# Patient Record
Sex: Female | Born: 1995
Health system: Southern US, Community
[De-identification: ages and names within clinical notes are randomized; demographics above are authoritative.]

## PROBLEM LIST (undated history)

## (undated) DIAGNOSIS — F419 Anxiety disorder, unspecified: Secondary | ICD-10-CM

## (undated) DIAGNOSIS — L309 Dermatitis, unspecified: Secondary | ICD-10-CM

## (undated) HISTORY — PX: COCHLEAR IMPLANT: SUR684

## (undated) HISTORY — PX: COCHLEAR IMPLANT REMOVAL: SHX1365

## (undated) HISTORY — PX: MASTECTOMY: SHX3

---
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
## 2014-12-09 ENCOUNTER — Encounter: Payer: Self-pay | Admitting: *Deleted

## 2014-12-09 ENCOUNTER — Emergency Department (INDEPENDENT_AMBULATORY_CARE_PROVIDER_SITE_OTHER)
Admission: EM | Admit: 2014-12-09 | Discharge: 2014-12-09 | Disposition: A | Discharge status: Home / Self Care | Payer: 59 | Source: Home / Self Care | Attending: Emergency Medicine | Admitting: Emergency Medicine

## 2014-12-09 DIAGNOSIS — B2790 Infectious mononucleosis, unspecified without complication: Secondary | ICD-10-CM

## 2014-12-09 DIAGNOSIS — J039 Acute tonsillitis, unspecified: Secondary | ICD-10-CM

## 2014-12-09 DIAGNOSIS — J029 Acute pharyngitis, unspecified: Secondary | ICD-10-CM

## 2014-12-09 LAB — POCT MONO SCREEN (KUC): Mono, POC: POSITIVE — AB

## 2014-12-09 LAB — POCT RAPID STREP A (OFFICE): Rapid Strep A Screen: NEGATIVE

## 2014-12-09 MED ORDER — METHYLPREDNISOLONE ACETATE 80 MG/ML IJ SUSP
80.0000 mg | Freq: Once | INTRAMUSCULAR | Status: AC
  Administered 2014-12-09: 80 mg via INTRAMUSCULAR

## 2014-12-09 MED ORDER — PREDNISONE 20 MG PO TABS: 20.0000 mg | ORAL_TABLET | Freq: Two times a day (BID) | ORAL | Status: DC

## 2014-12-09 MED ORDER — CEFDINIR 300 MG PO CAPS: 300.0000 mg | ORAL_CAPSULE | Freq: Two times a day (BID) | ORAL | Status: DC

## 2014-12-09 NOTE — ED Notes (Signed)
Pt c/o sore throat, HA, fatigue, and diarrhea x 11/30/14.

## 2014-12-09 NOTE — ED Provider Notes (Signed)
CSN: [id number]     Arrival date & time 12/09/14  1140 History   First MD Initiated Contact with Patient 12/09/14 1148     Chief Complaint  Patient presents with  . Sore Throat  . Headache    HPI SORE THROAT Onset: 10 days    Severity: moderate-severe, progressively worsening. Tried OTC meds without significant relief. + Fatigue, progressively worsening. No syncope or focal neurologic symptoms. Associated Symptoms:  + Minimal Fever  + Swollen neck glands No Recent Strep Exposure   No Myalgias Mild, nonfocal Headache.  No Rash No Discolored Nasal Mucus No Allergy symptoms No sinus pain/pressure No itchy/red eyes No earache No Drooling No Trismus  +Minimal Nausea No Vomiting No Abdominal pain Occasional Diarrhea, no blood or mucus. No current diarrhea. Denies recent foreign travel No Reflux symptoms No Cough No Breathing Difficulty No Shortness of Breath No pleuritic pain No Wheezing No Hemoptysis Denies GYN or GU symptoms. Denies chance of pregnancy. Last menstrual period normal 1 week ago.  History reviewed. No pertinent past medical history. Past Surgical History  Procedure Laterality Date  . Cochlear implant      placed and removed 2012   History reviewed. No pertinent family history. History  Substance Use Topics  . Smoking status: Former Games developermoker  . Smokeless tobacco: Not on file  . Alcohol Use: No   OB History    No data available     Review of Systems  All other systems reviewed and are negative.   Allergies  Review of patient's allergies indicates no known allergies.  Home Medications   Prior to Admission medications   Medication Sig Start Date End Date Taking? Authorizing Provider  cefdinir (OMNICEF) 300 MG capsule Take 1 capsule (300 mg total) by mouth 2 (two) times daily. X 10 days 12/09/14   Lajean Manesavid Massey, MD  predniSONE (DELTASONE) 20 MG tablet Take 1 tablet (20 mg total) by mouth 2 (two) times daily with a meal. X 5 days 12/09/14    Lajean Manesavid Massey, MD   BP 113/73 mmHg  Pulse 65  Temp(Src) 98.2 F (36.8 C) (Oral)  Resp 16  Ht 5\' 1"  (1.549 m)  Wt 141 lb (63.957 kg)  BMI 26.66 kg/m2  SpO2 99% Physical Exam  Constitutional: She is oriented to person, place, and time. She appears well-developed and well-nourished.  Non-toxic appearance. She appears ill. No distress.  HENT:  Head: Normocephalic and atraumatic.  Right Ear: Tympanic membrane, external ear and ear canal normal.  Left Ear: Tympanic membrane, external ear and ear canal normal.  Nose: Nose normal. Right sinus exhibits no maxillary sinus tenderness and no frontal sinus tenderness. Left sinus exhibits no maxillary sinus tenderness and no frontal sinus tenderness.  Mouth/Throat: Uvula is midline and mucous membranes are normal. No oral lesions. Posterior oropharyngeal erythema present. No oropharyngeal exudate or tonsillar abscesses.  4+ Tonsillar redness and enlargement. Whitish exudates bilaterally  Airway intact.  Eyes: Conjunctivae are normal. No scleral icterus.  Neck: Neck supple.  Cardiovascular: Normal rate, regular rhythm and normal heart sounds.   No murmur heard. Pulmonary/Chest: Effort normal and breath sounds normal. No stridor. No respiratory distress. She has no wheezes. She has no rhonchi. She has no rales.  Abdominal: Soft. She exhibits no mass. There is no hepatosplenomegaly. There is no tenderness.  Lymphadenopathy:    She has cervical adenopathy.       Right cervical: Superficial cervical adenopathy present. No deep cervical and no posterior cervical adenopathy present.  Left cervical: Superficial cervical adenopathy present. No deep cervical and no posterior cervical adenopathy present.  Neurological: She is alert and oriented to person, place, and time.  Skin: Skin is warm. No rash noted.  Psychiatric: She has a normal mood and affect.  Nursing note and vitals reviewed.   ED Course  Procedures (including critical care time) Labs  Review Labs Reviewed  POCT MONO SCREEN [facility] - Abnormal; Notable for the following:    Mono, POC Positive (*)    All other components within normal limits  STREP A DNA PROBE  POCT RAPID STREP A (OFFICE)   Results for orders placed or performed during the hospital encounter of 12/09/14  POCT rapid strep A  Result Value Ref Range   Rapid Strep A Screen Negative Negative  POCT mono screen  Result Value Ref Range   Mono, POC Positive (A) Negative    MDM   1. Infectious mononucleosis   2. Acute pharyngitis, unspecified pharyngitis type   3. Exudative tonsillitis   Positive Monospot Rapid strep test negative, will send off strep culture Discussed with patient at length. Handout on mononucleosis given. Treatment options discussed, as well as risks, benefits, alternatives. Patient voiced understanding and agreement with the following plans: Depo-Medrol 80 mg IM New Prescriptions   CEFDINIR (OMNICEF) 300 MG CAPSULE    Take 1 capsule (300 mg total) by mouth 2 (two) times daily. X 10 days   PREDNISONE (DELTASONE) 20 MG TABLET    Take 1 tablet (20 mg total) by mouth 2 (two) times daily with a meal. X 5 days   Over 40 minutes spent, greater than 50% of the time spent for counseling and coordination of care. Follow-up with your primary care doctor in 5-7 days if not improving, or sooner if symptoms become worse. Precautions discussed. Red flags discussed.--Emergency room if any red flag Questions invited and answered. Patient voiced understanding and agreement.   Lajean Manesavid Massey, MD 12/09/14 [phone]

## 2014-12-10 LAB — STREP A DNA PROBE: GASP: NEGATIVE

## 2016-05-09 MED FILL — CLOBETASOL 0.05% OINTMENT: 0.05 | 20 days supply | Qty: 60 | Fill #0

## 2016-05-26 DIAGNOSIS — L700 Acne vulgaris: Secondary | ICD-10-CM | POA: Diagnosis not present

## 2016-05-26 DIAGNOSIS — F633 Trichotillomania: Secondary | ICD-10-CM | POA: Diagnosis not present

## 2016-05-26 DIAGNOSIS — L2084 Intrinsic (allergic) eczema: Secondary | ICD-10-CM | POA: Diagnosis not present

## 2016-05-26 MED FILL — HYDROCORTISONE 2.5% CREAM: 2.5 | 30 days supply | Qty: 60 | Fill #0

## 2016-05-26 MED FILL — TRIAMCINOLONE 0.1% OINTMENT: 0.1 | 30 days supply | Qty: 454 | Fill #0

## 2016-05-26 MED FILL — ACZONE 5% GEL: 5 | 60 days supply | Qty: 60 | Fill #0

## 2016-08-02 DIAGNOSIS — H1045 Other chronic allergic conjunctivitis: Secondary | ICD-10-CM | POA: Diagnosis not present

## 2016-08-02 DIAGNOSIS — H52223 Regular astigmatism, bilateral: Secondary | ICD-10-CM | POA: Diagnosis not present

## 2016-08-02 DIAGNOSIS — Z0000 Encounter for general adult medical examination without abnormal findings: Secondary | ICD-10-CM | POA: Diagnosis not present

## 2016-08-02 DIAGNOSIS — E559 Vitamin D deficiency, unspecified: Secondary | ICD-10-CM | POA: Diagnosis not present

## 2016-08-03 DIAGNOSIS — E559 Vitamin D deficiency, unspecified: Secondary | ICD-10-CM | POA: Diagnosis not present

## 2016-08-03 DIAGNOSIS — Z0000 Encounter for general adult medical examination without abnormal findings: Secondary | ICD-10-CM | POA: Diagnosis not present

## 2016-09-23 DIAGNOSIS — L219 Seborrheic dermatitis, unspecified: Secondary | ICD-10-CM | POA: Diagnosis not present

## 2016-09-23 DIAGNOSIS — L700 Acne vulgaris: Secondary | ICD-10-CM | POA: Diagnosis not present

## 2016-09-23 DIAGNOSIS — L2089 Other atopic dermatitis: Secondary | ICD-10-CM | POA: Diagnosis not present

## 2016-12-21 MED FILL — TRIAMCINOLONE 0.1% OINTMENT: 0.1 | 30 days supply | Qty: 454 | Fill #1

## 2016-12-22 MED FILL — ESCITALOPRAM 10 MG TABLET: 10 | 30 days supply | Qty: 30 | Fill #0

## 2017-01-09 ENCOUNTER — Encounter: Payer: Self-pay | Admitting: *Deleted

## 2017-01-09 ENCOUNTER — Emergency Department
Admission: EM | Admit: 2017-01-09 | Discharge: 2017-01-09 | Disposition: A | Discharge status: Home / Self Care | Payer: 59 | Source: Home / Self Care | Attending: Family Medicine | Admitting: Family Medicine

## 2017-01-09 DIAGNOSIS — M545 Low back pain, unspecified: Secondary | ICD-10-CM

## 2017-01-09 DIAGNOSIS — L739 Follicular disorder, unspecified: Secondary | ICD-10-CM

## 2017-01-09 MED ORDER — DOXYCYCLINE HYCLATE 100 MG PO CAPS: 100.0000 mg | ORAL_CAPSULE | Freq: Two times a day (BID) | ORAL | 1 refills | Status: DC

## 2017-01-09 MED ORDER — MELOXICAM 15 MG PO TABS: 15.0000 mg | ORAL_TABLET | Freq: Every day | ORAL | 1 refills | Status: AC

## 2017-01-09 MED ORDER — METHOCARBAMOL 750 MG PO TABS: ORAL_TABLET | ORAL | 1 refills | Status: AC

## 2017-01-09 NOTE — Discharge Instructions (Signed)
Apply ice pack to lower back for 20 to 30 minutes, 3 to 4 times daily  Continue until pain decreases.  Begin range of motion and stretching exercises as tolerated.

## 2017-01-09 NOTE — ED Provider Notes (Signed)
[facility]    CSN: [id number] Arrival date & time: 01/09/17  1609     History   Chief Complaint Chief Complaint  Patient presents with  . Back Pain    HPI Katrina Burgess is a 21 y.o. female.   Patient presents with two complaints: 1)  For about 1.5 weeks she has had recurrent non-radiating lower back pain.  She has begun working out in gym recently but does not remember injury.   She denies bowel or bladder dysfunction, and no saddle numbness.  She injured her lower back playing soccer about 4 years ago.  X-rays were negative, and she improved after physical therapy.  She states that she tends to have recurring back pain during weather changes.  2)  She also complains of intermittently recurring painful "bumps" in both axillae that recur intermittently and resolve spontaneously.  She states that she rarely shaves her axillae.  She presently has 3 areas of soreness in her left axilla.   The history is provided by the patient.  Back Pain  Location:  Lumbar spine Quality:  Stabbing Radiates to:  Does not radiate Pain severity:  Moderate Pain is:  Worse during the day Onset quality:  Sudden Duration:  10 days Timing:  Intermittent Progression:  Unchanged Chronicity:  Recurrent Context comment:  Worse with weather changes Relieved by:  None tried Worsened by:  Standing Ineffective treatments:  Heating pad Associated symptoms: no abdominal pain, no abdominal swelling, no bladder incontinence, no bowel incontinence, no chest pain, no dysuria, no fever, no headaches, no leg pain, no numbness, no paresthesias, no pelvic pain, no perianal numbness, no tingling, no weakness and no weight loss   Risk factors: obesity     History reviewed. No pertinent past medical history.  There are no active problems to display for this patient.   Past Surgical History:  Procedure Laterality Date  . COCHLEAR IMPLANT     placed and removed 2012    OB History    No data  available       Home Medications    Prior to Admission medications   Medication Sig Start Date End Date Taking? Authorizing Provider  escitalopram (LEXAPRO) 10 MG tablet Take 10 mg by mouth daily.   Yes Historical Provider, MD  doxycycline (VIBRAMYCIN) 100 MG capsule Take 1 capsule (100 mg total) by mouth 2 (two) times daily. Take with food. 01/09/17   Lattie Haw, MD  meloxicam (MOBIC) 15 MG tablet Take 1 tablet (15 mg total) by mouth daily. Take with food each morning 01/09/17   Lattie Haw, MD  methocarbamol (ROBAXIN) 750 MG tablet One tab PO HS for muscle spasm 01/09/17   Lattie Haw, MD    Family History History reviewed. No pertinent family history.  Social History Social History  Substance Use Topics  . Smoking status: Former Games developer  . Smokeless tobacco: Never Used  . Alcohol use No     Allergies   Patient has no known allergies.   Review of Systems Review of Systems  Constitutional: Negative for fever and weight loss.  HENT: Negative.   Eyes: Negative.   Respiratory: Negative.   Cardiovascular: Negative for chest pain.  Gastrointestinal: Negative for abdominal pain and bowel incontinence.  Genitourinary: Negative for bladder incontinence, dysuria and pelvic pain.  Musculoskeletal: Positive for back pain.  Skin: Positive for rash.  Neurological: Negative for tingling, weakness, numbness, headaches and paresthesias.     Physical Exam Triage Vital Signs  ED Triage Vitals  Enc Vitals Group     BP 01/09/17 1643 124/82     Pulse Rate 01/09/17 1643 93     Resp --      Temp 01/09/17 1643 98 F (36.7 C)     Temp Source 01/09/17 1643 Oral     SpO2 01/09/17 1643 100 %     Weight 01/09/17 1644 170 lb (77.1 kg)     Height --      Head Circumference --      Peak Flow --      Pain Score 01/09/17 1645 6     Pain Loc --      Pain Edu? --      Excl. in GC? --    No data found.   Updated Vital Signs BP 124/82 (BP Location: Left Arm)   Pulse 93    Temp 98 F (36.7 C) (Oral)   Wt 170 lb (77.1 kg)   SpO2 100%   BMI 32.12 kg/m   Visual Acuity Right Eye Distance:   Left Eye Distance:   Bilateral Distance:    Right Eye Near:   Left Eye Near:    Bilateral Near:     Physical Exam  Constitutional: She appears well-developed and well-nourished. No distress.  HENT:  Head: Normocephalic.  Right Ear: External ear normal.  Left Ear: External ear normal.  Nose: Nose normal.  Mouth/Throat: Oropharynx is clear and moist.  Eyes: Conjunctivae are normal. Pupils are equal, round, and reactive to light.  Neck: Normal range of motion.  Cardiovascular: Normal heart sounds.   Pulmonary/Chest: Breath sounds normal.  Abdominal: Soft. There is tenderness.  Musculoskeletal: She exhibits no edema.       Back:  Back:  Range of motion relatively well preserved.  Can heel/toe walk and squat without difficulty.  Tenderness in the midline and bilateral paraspinous muscles from L1 to Sacral area.  Straight leg raising test is negative.  Sitting knee extension test is negative.  Strength and sensation in the lower extremities is normal.  Patellar and achilles reflexes are normal.  Hips have full range of motion.  FABER negative.   Neurological: She is alert.  Skin: Skin is warm and dry.  Left axilla has three areas of peri-follicular erythema and tenderness without induration or fluctuance.  No pustules present.  Nursing note and vitals reviewed.    UC Treatments / Results  Labs (all labs ordered are listed, but only abnormal results are displayed) Labs Reviewed - No data to display  EKG  EKG Interpretation None       Radiology No results found.  Procedures Procedures (including critical care time)  Medications Ordered in UC Medications - No data to display   Initial Impression / Assessment and Plan / UC Course  I have reviewed the triage vital signs and the nursing notes.  Pertinent labs & imaging results that were available  during my care of the patient were reviewed by me and considered in my medical decision making (see chart for details).  Clinical Course   Begin Mobic 15mg  Daily, and Robaxin 750mg  HS. Apply ice pack for 20 to 30 minutes, 3 to 4 times daily  Continue until pain decreases.  Begin range of motion and stretching exercises as tolerated. Consider PT (patient will notify us if she wishes referral). Followup with Dr. Rodney Langtonhomas Thekkekandam or Dr. Clementeen GrahamEvan [facility] ([facility] if not improving about two weeks.  Begin doxycycline 100mg  BID; continue for about 5  to 7 days until lesions resolve.  May repeat treatment if recurs.    Final Clinical Impressions(s) / UC Diagnoses   Final diagnoses:  Bilateral low back pain without sciatica, unspecified chronicity  Folliculitis    New Prescriptions New Prescriptions   DOXYCYCLINE (VIBRAMYCIN) 100 MG CAPSULE    Take 1 capsule (100 mg total) by mouth 2 (two) times daily. Take with food.   MELOXICAM (MOBIC) 15 MG TABLET    Take 1 tablet (15 mg total) by mouth daily. Take with food each morning   METHOCARBAMOL (ROBAXIN) 750 MG TABLET    One tab PO HS for muscle spasm     Lattie Haw, MD 01/09/17 1753

## 2017-01-09 NOTE — ED Triage Notes (Signed)
Patient c/o recurrent low back that initially started with an injury playing soccer in 2014. More recently worse. Used heat @ home. No otc meds used.

## 2017-01-10 MED FILL — DOXYCYCLINE HYCLATE 100 MG: 100 | 10 days supply | Qty: 20 | Fill #0

## 2017-01-10 MED FILL — MELOXICAM 15 MG TABLET: 15 | 15 days supply | Qty: 15 | Fill #0

## 2017-01-10 MED FILL — METHOCARBAMOL 750 MG TABLET: 750 | 15 days supply | Qty: 15 | Fill #0

## 2017-02-20 MED FILL — ESCITALOPRAM 10 MG TABLET: 10 | 30 days supply | Qty: 30 | Fill #1

## 2017-05-29 DIAGNOSIS — L2089 Other atopic dermatitis: Secondary | ICD-10-CM | POA: Diagnosis not present

## 2017-05-29 DIAGNOSIS — L219 Seborrheic dermatitis, unspecified: Secondary | ICD-10-CM | POA: Diagnosis not present

## 2017-05-29 DIAGNOSIS — L700 Acne vulgaris: Secondary | ICD-10-CM | POA: Diagnosis not present

## 2017-05-29 DIAGNOSIS — L2084 Intrinsic (allergic) eczema: Secondary | ICD-10-CM | POA: Diagnosis not present

## 2017-05-29 DIAGNOSIS — F633 Trichotillomania: Secondary | ICD-10-CM | POA: Diagnosis not present

## 2017-05-29 DIAGNOSIS — L089 Local infection of the skin and subcutaneous tissue, unspecified: Secondary | ICD-10-CM | POA: Diagnosis not present

## 2017-05-29 MED FILL — DAPSONE 5% GEL: 5 | 30 days supply | Qty: 60 | Fill #0

## 2017-05-29 MED FILL — CLOBETASOL 0.05% OINTMENT: 0.05 | 10 days supply | Qty: 60 | Fill #0

## 2017-05-29 MED FILL — AVITA 0.025% CREAM: 0.025 | 90 days supply | Qty: 45 | Fill #0

## 2017-05-29 MED FILL — TRIAMCINOLONE 0.1% OINTMENT: 0.1 | 30 days supply | Qty: 454 | Fill #0

## 2017-06-12 DIAGNOSIS — L02811 Cutaneous abscess of head [any part, except face]: Secondary | ICD-10-CM | POA: Diagnosis not present

## 2017-06-12 MED FILL — HYDROCODON-APAP 5-325: 5-325 | 2 days supply | Qty: 6 | Fill #0

## 2017-06-12 MED FILL — SULFAMETHOXAZOLE/TMP DS TAB: 800-160 | 10 days supply | Qty: 20 | Fill #0

## 2017-08-31 DIAGNOSIS — M545 Low back pain: Secondary | ICD-10-CM | POA: Diagnosis not present

## 2017-09-08 DIAGNOSIS — L259 Unspecified contact dermatitis, unspecified cause: Secondary | ICD-10-CM | POA: Diagnosis not present

## 2017-09-08 DIAGNOSIS — L2084 Intrinsic (allergic) eczema: Secondary | ICD-10-CM | POA: Diagnosis not present

## 2017-09-08 MED FILL — ELIDEL 1% CREAM: 1 | 30 days supply | Qty: 60 | Fill #0

## 2017-09-30 DIAGNOSIS — R21 Rash and other nonspecific skin eruption: Secondary | ICD-10-CM | POA: Diagnosis not present

## 2017-12-15 DIAGNOSIS — L089 Local infection of the skin and subcutaneous tissue, unspecified: Secondary | ICD-10-CM | POA: Diagnosis not present

## 2017-12-15 DIAGNOSIS — L2084 Intrinsic (allergic) eczema: Secondary | ICD-10-CM | POA: Diagnosis not present

## 2017-12-15 DIAGNOSIS — Z79899 Other long term (current) drug therapy: Secondary | ICD-10-CM | POA: Diagnosis not present

## 2017-12-15 MED FILL — METHOTREXATE 2.5 MG TABLET: 2.5 | 28 days supply | Qty: 12 | Fill #0

## 2017-12-15 MED FILL — CLOBETASOL 0.05% OINTMENT: 0.05 | 14 days supply | Qty: 60 | Fill #0

## 2017-12-15 MED FILL — hydrOXYzine HCL 10 MG TABS: 10 | 10 days supply | Qty: 90 | Fill #0 | Status: TO

## 2017-12-15 MED FILL — FOLIC ACID 1 MG TABLET: 1 | 30 days supply | Qty: 30 | Fill #0

## 2018-01-12 DIAGNOSIS — Z5181 Encounter for therapeutic drug level monitoring: Secondary | ICD-10-CM | POA: Diagnosis not present

## 2018-01-12 DIAGNOSIS — Z79899 Other long term (current) drug therapy: Secondary | ICD-10-CM | POA: Diagnosis not present

## 2018-01-12 MED FILL — hydrOXYzine HCL 10 MG TABS: 10 | 10 days supply | Qty: 90 | Fill #0 | Status: TO

## 2018-02-02 MED FILL — hydrOXYzine HCL 10 MG TABS: 10 | 10 days supply | Qty: 90 | Fill #0

## 2018-02-02 MED FILL — FOLIC ACID 1 MG TABLET: 1 | 30 days supply | Qty: 30 | Fill #1

## 2018-02-19 DIAGNOSIS — L2084 Intrinsic (allergic) eczema: Secondary | ICD-10-CM | POA: Diagnosis not present

## 2018-02-19 DIAGNOSIS — Z79899 Other long term (current) drug therapy: Secondary | ICD-10-CM | POA: Diagnosis not present

## 2018-02-28 DIAGNOSIS — Z79899 Other long term (current) drug therapy: Secondary | ICD-10-CM | POA: Diagnosis not present

## 2018-03-05 DIAGNOSIS — Z5181 Encounter for therapeutic drug level monitoring: Secondary | ICD-10-CM | POA: Diagnosis not present

## 2018-03-05 MED FILL — METHOTREXATE 2.5 MG TABLET: 2.5 | 28 days supply | Qty: 12 | Fill #1

## 2018-03-09 MED FILL — hydrOXYzine HCL 10 MG TABS: 10 | 10 days supply | Qty: 90 | Fill #1

## 2018-03-13 DIAGNOSIS — F633 Trichotillomania: Secondary | ICD-10-CM | POA: Diagnosis not present

## 2018-03-16 DIAGNOSIS — L2084 Intrinsic (allergic) eczema: Secondary | ICD-10-CM | POA: Diagnosis not present

## 2018-03-20 DIAGNOSIS — F633 Trichotillomania: Secondary | ICD-10-CM | POA: Diagnosis not present

## 2018-03-23 DIAGNOSIS — L2084 Intrinsic (allergic) eczema: Secondary | ICD-10-CM | POA: Diagnosis not present

## 2018-03-30 MED FILL — METHOTREXATE 2.5 MG TABLET: 2.5 | 28 days supply | Qty: 12 | Fill #2

## 2018-04-05 DIAGNOSIS — F633 Trichotillomania: Secondary | ICD-10-CM | POA: Diagnosis not present

## 2018-04-05 MED FILL — hydrOXYzine HCL 10 MG TABS: 10 | 10 days supply | Qty: 90 | Fill #0

## 2018-04-13 DIAGNOSIS — F633 Trichotillomania: Secondary | ICD-10-CM | POA: Diagnosis not present

## 2018-04-27 DIAGNOSIS — F633 Trichotillomania: Secondary | ICD-10-CM | POA: Diagnosis not present

## 2018-05-07 MED FILL — hydrOXYzine HCL 10 MG TABS: 10 | 10 days supply | Qty: 90 | Fill #1 | Status: TO

## 2018-05-11 DIAGNOSIS — F633 Trichotillomania: Secondary | ICD-10-CM | POA: Diagnosis not present

## 2018-05-16 DIAGNOSIS — F428 Other obsessive-compulsive disorder: Secondary | ICD-10-CM | POA: Diagnosis not present

## 2018-05-16 DIAGNOSIS — F633 Trichotillomania: Secondary | ICD-10-CM | POA: Diagnosis not present

## 2018-05-25 DIAGNOSIS — F633 Trichotillomania: Secondary | ICD-10-CM | POA: Diagnosis not present

## 2018-05-30 DIAGNOSIS — L2084 Intrinsic (allergic) eczema: Secondary | ICD-10-CM | POA: Diagnosis not present

## 2018-05-30 DIAGNOSIS — Z79899 Other long term (current) drug therapy: Secondary | ICD-10-CM | POA: Diagnosis not present

## 2018-05-30 MED FILL — FOLIC ACID 1 MG TABS: 1 | 30 days supply | Qty: 30 | Fill #0 | Status: TO

## 2018-05-30 MED FILL — METHOTREXATE 2.5 MG TABLET: 2.5 | 28 days supply | Qty: 20 | Fill #0 | Status: TO

## 2018-05-31 DIAGNOSIS — F633 Trichotillomania: Secondary | ICD-10-CM | POA: Diagnosis not present

## 2018-06-06 DIAGNOSIS — F428 Other obsessive-compulsive disorder: Secondary | ICD-10-CM | POA: Diagnosis not present

## 2018-06-08 DIAGNOSIS — F633 Trichotillomania: Secondary | ICD-10-CM | POA: Diagnosis not present

## 2018-06-20 DIAGNOSIS — F428 Other obsessive-compulsive disorder: Secondary | ICD-10-CM | POA: Diagnosis not present

## 2018-06-22 DIAGNOSIS — F633 Trichotillomania: Secondary | ICD-10-CM | POA: Diagnosis not present

## 2018-06-27 DIAGNOSIS — F428 Other obsessive-compulsive disorder: Secondary | ICD-10-CM | POA: Diagnosis not present

## 2018-07-06 DIAGNOSIS — F633 Trichotillomania: Secondary | ICD-10-CM | POA: Diagnosis not present

## 2018-07-11 DIAGNOSIS — F428 Other obsessive-compulsive disorder: Secondary | ICD-10-CM | POA: Diagnosis not present

## 2018-07-25 DIAGNOSIS — F428 Other obsessive-compulsive disorder: Secondary | ICD-10-CM | POA: Diagnosis not present

## 2018-08-07 DIAGNOSIS — F428 Other obsessive-compulsive disorder: Secondary | ICD-10-CM | POA: Diagnosis not present

## 2018-08-17 DIAGNOSIS — F428 Other obsessive-compulsive disorder: Secondary | ICD-10-CM | POA: Diagnosis not present

## 2018-08-22 DIAGNOSIS — F428 Other obsessive-compulsive disorder: Secondary | ICD-10-CM | POA: Diagnosis not present

## 2018-09-05 DIAGNOSIS — F428 Other obsessive-compulsive disorder: Secondary | ICD-10-CM | POA: Diagnosis not present

## 2018-09-20 DIAGNOSIS — F428 Other obsessive-compulsive disorder: Secondary | ICD-10-CM | POA: Diagnosis not present

## 2018-10-03 DIAGNOSIS — L700 Acne vulgaris: Secondary | ICD-10-CM | POA: Diagnosis not present

## 2018-10-03 DIAGNOSIS — Z79899 Other long term (current) drug therapy: Secondary | ICD-10-CM | POA: Diagnosis not present

## 2018-10-03 DIAGNOSIS — L2084 Intrinsic (allergic) eczema: Secondary | ICD-10-CM | POA: Diagnosis not present

## 2018-10-12 DIAGNOSIS — F428 Other obsessive-compulsive disorder: Secondary | ICD-10-CM | POA: Diagnosis not present

## 2018-10-17 DIAGNOSIS — F428 Other obsessive-compulsive disorder: Secondary | ICD-10-CM | POA: Diagnosis not present

## 2018-10-31 DIAGNOSIS — F428 Other obsessive-compulsive disorder: Secondary | ICD-10-CM | POA: Diagnosis not present

## 2018-11-05 DIAGNOSIS — F428 Other obsessive-compulsive disorder: Secondary | ICD-10-CM | POA: Diagnosis not present

## 2018-11-28 DIAGNOSIS — F428 Other obsessive-compulsive disorder: Secondary | ICD-10-CM | POA: Diagnosis not present

## 2018-12-05 DIAGNOSIS — Z6835 Body mass index (BMI) 35.0-35.9, adult: Secondary | ICD-10-CM | POA: Diagnosis not present

## 2018-12-05 DIAGNOSIS — Z01419 Encounter for gynecological examination (general) (routine) without abnormal findings: Secondary | ICD-10-CM | POA: Diagnosis not present

## 2018-12-06 DIAGNOSIS — Z113 Encounter for screening for infections with a predominantly sexual mode of transmission: Secondary | ICD-10-CM | POA: Diagnosis not present

## 2018-12-06 DIAGNOSIS — Z01419 Encounter for gynecological examination (general) (routine) without abnormal findings: Secondary | ICD-10-CM | POA: Diagnosis not present

## 2018-12-07 DIAGNOSIS — Z01419 Encounter for gynecological examination (general) (routine) without abnormal findings: Secondary | ICD-10-CM | POA: Diagnosis not present

## 2018-12-12 DIAGNOSIS — F411 Generalized anxiety disorder: Secondary | ICD-10-CM | POA: Diagnosis not present

## 2018-12-12 DIAGNOSIS — G4700 Insomnia, unspecified: Secondary | ICD-10-CM | POA: Diagnosis not present

## 2018-12-12 DIAGNOSIS — F633 Trichotillomania: Secondary | ICD-10-CM | POA: Diagnosis not present

## 2019-01-01 DIAGNOSIS — F411 Generalized anxiety disorder: Secondary | ICD-10-CM | POA: Diagnosis not present

## 2019-01-28 DIAGNOSIS — G4700 Insomnia, unspecified: Secondary | ICD-10-CM | POA: Diagnosis not present

## 2019-01-28 DIAGNOSIS — F411 Generalized anxiety disorder: Secondary | ICD-10-CM | POA: Diagnosis not present

## 2019-01-28 DIAGNOSIS — F633 Trichotillomania: Secondary | ICD-10-CM | POA: Diagnosis not present

## 2019-02-25 DIAGNOSIS — F411 Generalized anxiety disorder: Secondary | ICD-10-CM | POA: Diagnosis not present

## 2019-02-25 DIAGNOSIS — G4700 Insomnia, unspecified: Secondary | ICD-10-CM | POA: Diagnosis not present

## 2019-02-25 DIAGNOSIS — F633 Trichotillomania: Secondary | ICD-10-CM | POA: Diagnosis not present

## 2019-03-18 DIAGNOSIS — Z7689 Persons encountering health services in other specified circumstances: Secondary | ICD-10-CM | POA: Diagnosis not present

## 2019-03-18 DIAGNOSIS — L700 Acne vulgaris: Secondary | ICD-10-CM | POA: Diagnosis not present

## 2019-03-18 DIAGNOSIS — L2084 Intrinsic (allergic) eczema: Secondary | ICD-10-CM | POA: Diagnosis not present

## 2019-06-07 DIAGNOSIS — Z03818 Encounter for observation for suspected exposure to other biological agents ruled out: Secondary | ICD-10-CM | POA: Diagnosis not present

## 2019-06-10 DIAGNOSIS — F411 Generalized anxiety disorder: Secondary | ICD-10-CM | POA: Diagnosis not present

## 2019-06-10 DIAGNOSIS — F633 Trichotillomania: Secondary | ICD-10-CM | POA: Diagnosis not present

## 2019-06-10 DIAGNOSIS — G4700 Insomnia, unspecified: Secondary | ICD-10-CM | POA: Diagnosis not present

## 2019-08-02 DIAGNOSIS — F411 Generalized anxiety disorder: Secondary | ICD-10-CM | POA: Diagnosis not present

## 2019-08-02 DIAGNOSIS — G4700 Insomnia, unspecified: Secondary | ICD-10-CM | POA: Diagnosis not present

## 2019-08-02 DIAGNOSIS — F633 Trichotillomania: Secondary | ICD-10-CM | POA: Diagnosis not present

## 2019-09-19 DIAGNOSIS — Z113 Encounter for screening for infections with a predominantly sexual mode of transmission: Secondary | ICD-10-CM | POA: Diagnosis not present

## 2019-09-19 DIAGNOSIS — N760 Acute vaginitis: Secondary | ICD-10-CM | POA: Diagnosis not present

## 2019-10-11 DIAGNOSIS — F411 Generalized anxiety disorder: Secondary | ICD-10-CM | POA: Diagnosis not present

## 2019-10-11 DIAGNOSIS — F633 Trichotillomania: Secondary | ICD-10-CM | POA: Diagnosis not present

## 2019-10-11 DIAGNOSIS — G4700 Insomnia, unspecified: Secondary | ICD-10-CM | POA: Diagnosis not present

## 2019-12-02 DIAGNOSIS — G4700 Insomnia, unspecified: Secondary | ICD-10-CM | POA: Diagnosis not present

## 2019-12-02 DIAGNOSIS — F633 Trichotillomania: Secondary | ICD-10-CM | POA: Diagnosis not present

## 2019-12-02 DIAGNOSIS — F411 Generalized anxiety disorder: Secondary | ICD-10-CM | POA: Diagnosis not present

## 2019-12-19 ENCOUNTER — Other Ambulatory Visit: Payer: Self-pay

## 2019-12-19 ENCOUNTER — Emergency Department
Admission: EM | Admit: 2019-12-19 | Discharge: 2019-12-19 | Disposition: A | Discharge status: Home / Self Care | Payer: BC Managed Care – PPO | Source: Home / Self Care | Attending: Family Medicine | Admitting: Family Medicine

## 2019-12-19 ENCOUNTER — Emergency Department (INDEPENDENT_AMBULATORY_CARE_PROVIDER_SITE_OTHER): Payer: BC Managed Care – PPO

## 2019-12-19 DIAGNOSIS — S60222A Contusion of left hand, initial encounter: Secondary | ICD-10-CM | POA: Diagnosis not present

## 2019-12-19 DIAGNOSIS — M79642 Pain in left hand: Secondary | ICD-10-CM

## 2019-12-19 HISTORY — DX: Dermatitis, unspecified: L30.9

## 2019-12-19 HISTORY — DX: Anxiety disorder, unspecified: F41.9

## 2019-12-19 NOTE — Discharge Instructions (Addendum)
Put ice on the injured area. Put ice in a plastic bag. Place a towel between your skin and the bag. Leave the ice on for 20 minutes, 2-3 times a day. My take Ibuprofen 200mg , 4 tabs every 8 hours with food.  Begin gentle range of motion exercises in about 3 to 5 days.

## 2019-12-19 NOTE — ED Provider Notes (Signed)
[facility]    CSN: [id number] Arrival date & time: 12/19/19  2376      History   Chief Complaint Chief Complaint  Patient presents with  . Hand Pain    HPI Katrina Burgess is a 23 y.o. female.   Patient "mashed" her left hand on her dishwasher two days ago.  She has had persistent swelling/pain on the dorsum of her hand.   Hand Pain This is a new problem. The current episode started 2 days ago. The problem occurs constantly. The problem has not changed since onset.Exacerbated by: grasping and finger movement. Nothing relieves the symptoms. Treatments tried: Ibuprofen. The treatment provided mild relief.    Past Medical History:  Diagnosis Date  . Anxiety   . Eczema     There are no problems to display for this patient.   Past Surgical History:  Procedure Laterality Date  . COCHLEAR IMPLANT     placed and removed 2012  . COCHLEAR IMPLANT    . COCHLEAR IMPLANT REMOVAL      OB History   No obstetric history on file.      Home Medications    Prior to Admission medications   Medication Sig Start Date End Date Taking? Authorizing Provider  hydrOXYzine (ATARAX/VISTARIL) 10 MG tablet TAKE 1 TO 3 TABLETS BY MOUTH EVERY 8 HOURS AS NEEDED FOR ITCHING 02/12/19  Yes [provider]  doxycycline (VIBRAMYCIN) 100 MG capsule Take 1 capsule (100 mg total) by mouth 2 (two) times daily. Take with food. 01/09/17   Kandra Nicolas, MD  escitalopram (LEXAPRO) 10 MG tablet Take 10 mg by mouth daily.    [provider]  FLUoxetine (PROZAC) 40 MG capsule  10/11/19   [provider]  meloxicam (MOBIC) 15 MG tablet Take 1 tablet (15 mg total) by mouth daily. Take with food each morning 01/09/17   Kandra Nicolas, MD  methocarbamol (ROBAXIN) 750 MG tablet One tab PO HS for muscle spasm 01/09/17   Kandra Nicolas, MD    Family History History reviewed. No pertinent family history.  Social History Social History   Tobacco Use  . Smoking  status: Former Research scientist (life sciences)  . Smokeless tobacco: Never Used  Substance Use Topics  . Alcohol use: No  . Drug use: No     Allergies   Patient has no known allergies.   Review of Systems Review of Systems  All other systems reviewed and are negative.    Physical Exam Triage Vital Signs ED Triage Vitals  Enc Vitals Group     BP 12/19/19 0952 135/84     Pulse Rate 12/19/19 0952 84     Resp 12/19/19 0952 20     Temp 12/19/19 0952 98.3 F (36.8 C)     Temp Source 12/19/19 0952 Oral     SpO2 12/19/19 0952 98 %     Weight 12/19/19 0953 200 lb (90.7 kg)     Height 12/19/19 0953 5\' 1"  (1.549 m)     Head Circumference --      Peak Flow --      Pain Score 12/19/19 0953 5     Pain Loc --      Pain Edu? --      Excl. in [place]? --    No data found.  Updated Vital Signs BP 135/84 (BP Location: Right Arm)   Pulse 84   Temp 98.3 F (36.8 C) (Oral)   Resp 20   Ht 5\' 1"  (  1.549 m)   Wt 90.7 kg   LMP 12/19/2019   SpO2 98%   BMI 37.79 kg/m   Visual Acuity Right Eye Distance:   Left Eye Distance:   Bilateral Distance:    Right Eye Near:   Left Eye Near:    Bilateral Near:     Physical Exam Vitals and nursing note reviewed.  Constitutional:      General: She is not in acute distress.    Appearance: She is obese.  HENT:     Head: Normocephalic.  Eyes:     Pupils: Pupils are equal, round, and reactive to light.  Cardiovascular:     Rate and Rhythm: Normal rate.  Pulmonary:     Effort: Pulmonary effort is normal.  Musculoskeletal:       Hands:     Comments: Mild tenderness to palpation dorsum left hand. All fingers left hand hand have full range of motion.  Distal neurovascular function is intact.  Distinct tenderness to palpation over extensor tendons 2nd and 3rd fingers with resisted extension.  Skin:    General: Skin is warm and dry.  Neurological:     Mental Status: She is alert.      UC Treatments / Results  Labs (all labs ordered are listed, but only  abnormal results are displayed) Labs Reviewed - No data to display  EKG   Radiology DG Hand Complete Left  Result Date: 12/19/2019 CLINICAL DATA:  Pain after slamming hand in dishwasher door EXAM: LEFT HAND - COMPLETE 3+ VIEW COMPARISON:  None. FINDINGS: Frontal, oblique, and lateral views were obtained. No fracture or dislocation. Joint spaces appear normal. No erosive change. IMPRESSION: No fracture or dislocation.  No evident arthropathy. Electronically Signed   By: Bretta Bang III M.D.   On: 12/19/2019 10:12    Procedures Procedures (including critical care time)  Medications Ordered in UC Medications - No data to display  Initial Impression / Assessment and Plan / UC Course  I have reviewed the triage vital signs and the nursing notes.  Pertinent labs & imaging results that were available during my care of the patient were reviewed by me and considered in my medical decision making (see chart for details).    Suspect mild extensor tendonitis. Followup with Dr. [facility] ([facility]) if not improving about two weeks.    Final Clinical Impressions(s) / UC Diagnoses   Final diagnoses:  Contusion of left hand, initial encounter     Discharge Instructions      Put ice on the injured area. ? Put ice in a plastic bag. ? Place a towel between your skin and the bag. ? Leave the ice on for 20 minutes, 2-3 times a day.  My take Ibuprofen 200mg , 4 tabs every 8 hours with food.   Begin gentle range of motion exercises in about 3 to 5 days.    ED Prescriptions    None        , MD 12/26/19 1407

## 2019-12-19 NOTE — ED Triage Notes (Signed)
Mashed left hand on dishwasher 2 days ago.  Swollen, painful and red

## 2020-02-17 DIAGNOSIS — F633 Trichotillomania: Secondary | ICD-10-CM | POA: Diagnosis not present

## 2020-02-17 DIAGNOSIS — F411 Generalized anxiety disorder: Secondary | ICD-10-CM | POA: Diagnosis not present

## 2020-02-17 DIAGNOSIS — G4700 Insomnia, unspecified: Secondary | ICD-10-CM | POA: Diagnosis not present

## 2020-04-13 DIAGNOSIS — G4700 Insomnia, unspecified: Secondary | ICD-10-CM | POA: Diagnosis not present

## 2020-04-13 DIAGNOSIS — F633 Trichotillomania: Secondary | ICD-10-CM | POA: Diagnosis not present

## 2020-04-13 DIAGNOSIS — F411 Generalized anxiety disorder: Secondary | ICD-10-CM | POA: Diagnosis not present

## 2020-04-14 DIAGNOSIS — G4700 Insomnia, unspecified: Secondary | ICD-10-CM | POA: Diagnosis not present

## 2020-04-14 DIAGNOSIS — F411 Generalized anxiety disorder: Secondary | ICD-10-CM | POA: Diagnosis not present

## 2020-04-14 DIAGNOSIS — F633 Trichotillomania: Secondary | ICD-10-CM | POA: Diagnosis not present

## 2020-06-30 DIAGNOSIS — F633 Trichotillomania: Secondary | ICD-10-CM | POA: Diagnosis not present

## 2020-06-30 DIAGNOSIS — G4700 Insomnia, unspecified: Secondary | ICD-10-CM | POA: Diagnosis not present

## 2020-06-30 DIAGNOSIS — F411 Generalized anxiety disorder: Secondary | ICD-10-CM | POA: Diagnosis not present

## 2020-09-28 DIAGNOSIS — F411 Generalized anxiety disorder: Secondary | ICD-10-CM | POA: Diagnosis not present

## 2020-09-28 DIAGNOSIS — G4700 Insomnia, unspecified: Secondary | ICD-10-CM | POA: Diagnosis not present

## 2020-09-28 DIAGNOSIS — F633 Trichotillomania: Secondary | ICD-10-CM | POA: Diagnosis not present

## 2020-10-08 DIAGNOSIS — Z23 Encounter for immunization: Secondary | ICD-10-CM | POA: Diagnosis not present

## 2020-10-28 DIAGNOSIS — Z6834 Body mass index (BMI) 34.0-34.9, adult: Secondary | ICD-10-CM | POA: Diagnosis not present

## 2020-10-28 DIAGNOSIS — Z01419 Encounter for gynecological examination (general) (routine) without abnormal findings: Secondary | ICD-10-CM | POA: Diagnosis not present

## 2020-10-29 DIAGNOSIS — Z23 Encounter for immunization: Secondary | ICD-10-CM | POA: Diagnosis not present

## 2021-05-17 DIAGNOSIS — N920 Excessive and frequent menstruation with regular cycle: Secondary | ICD-10-CM | POA: Diagnosis not present

## 2021-07-06 DIAGNOSIS — Z30431 Encounter for routine checking of intrauterine contraceptive device: Secondary | ICD-10-CM | POA: Diagnosis not present

## 2021-09-18 ENCOUNTER — Encounter (HOSPITAL_BASED_OUTPATIENT_CLINIC_OR_DEPARTMENT_OTHER): Payer: Self-pay | Admitting: *Deleted

## 2021-09-18 ENCOUNTER — Other Ambulatory Visit: Payer: Self-pay

## 2021-09-18 DIAGNOSIS — Z87891 Personal history of nicotine dependence: Secondary | ICD-10-CM | POA: Diagnosis not present

## 2021-09-18 DIAGNOSIS — S6991XA Unspecified injury of right wrist, hand and finger(s), initial encounter: Secondary | ICD-10-CM | POA: Diagnosis present

## 2021-09-18 DIAGNOSIS — X58XXXA Exposure to other specified factors, initial encounter: Secondary | ICD-10-CM | POA: Insufficient documentation

## 2021-09-18 DIAGNOSIS — S60211A Contusion of right wrist, initial encounter: Secondary | ICD-10-CM | POA: Diagnosis not present

## 2021-09-18 DIAGNOSIS — Y990 Civilian activity done for income or pay: Secondary | ICD-10-CM | POA: Diagnosis not present

## 2021-09-18 NOTE — ED Triage Notes (Signed)
Right wrist pain.  Swelling noted.

## 2021-09-19 ENCOUNTER — Emergency Department (HOSPITAL_BASED_OUTPATIENT_CLINIC_OR_DEPARTMENT_OTHER)
Admission: EM | Admit: 2021-09-19 | Discharge: 2021-09-19 | Disposition: A | Discharge status: Home / Self Care | Payer: No Typology Code available for payment source | Attending: Emergency Medicine | Admitting: Emergency Medicine

## 2021-09-19 ENCOUNTER — Emergency Department (HOSPITAL_BASED_OUTPATIENT_CLINIC_OR_DEPARTMENT_OTHER): Payer: No Typology Code available for payment source

## 2021-09-19 DIAGNOSIS — M25539 Pain in unspecified wrist: Secondary | ICD-10-CM

## 2021-09-19 MED ORDER — IBUPROFEN 800 MG PO TABS
800.0000 mg | ORAL_TABLET | Freq: Once | ORAL | Status: AC
  Administered 2021-09-19: 800 mg via ORAL
  Filled 2021-09-19: qty 1
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
## 2021-09-19 NOTE — Discharge Instructions (Addendum)
You were evaluated in the Emergency Department and after careful evaluation, we did not find any emergent condition requiring admission or further testing in the hospital.  Your exam/testing today was overall reassuring.  X-ray did not show any broken bones.  Please return to the Emergency Department if you experience any worsening of your condition.  Thank you for allowing us to be a part of your care.  

## 2022-11-25 IMAGING — DX DG WRIST COMPLETE 3+V*R*
4 series · 4 of 4 positions shown · non-contrast
Comparison: None.

CLINICAL DATA: Right wrist swelling

EXAM:
RIGHT WRIST - COMPLETE 3+ VIEW

[wrist ap (1 of 2)]
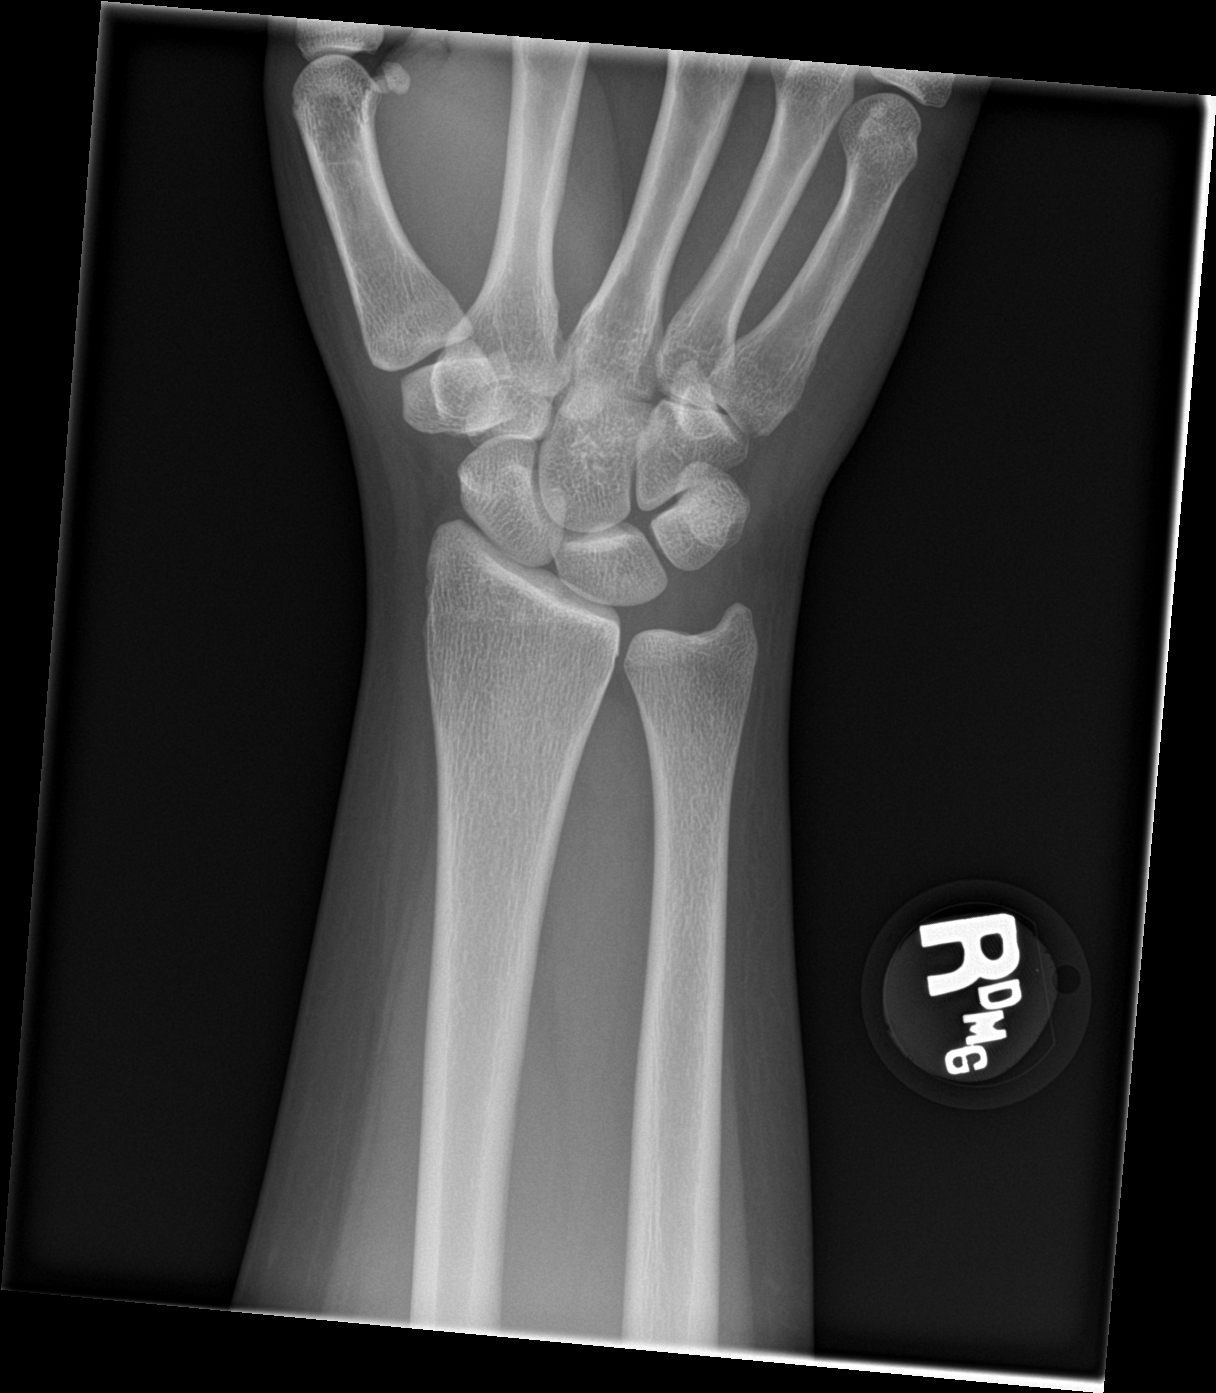

[wrist obl]
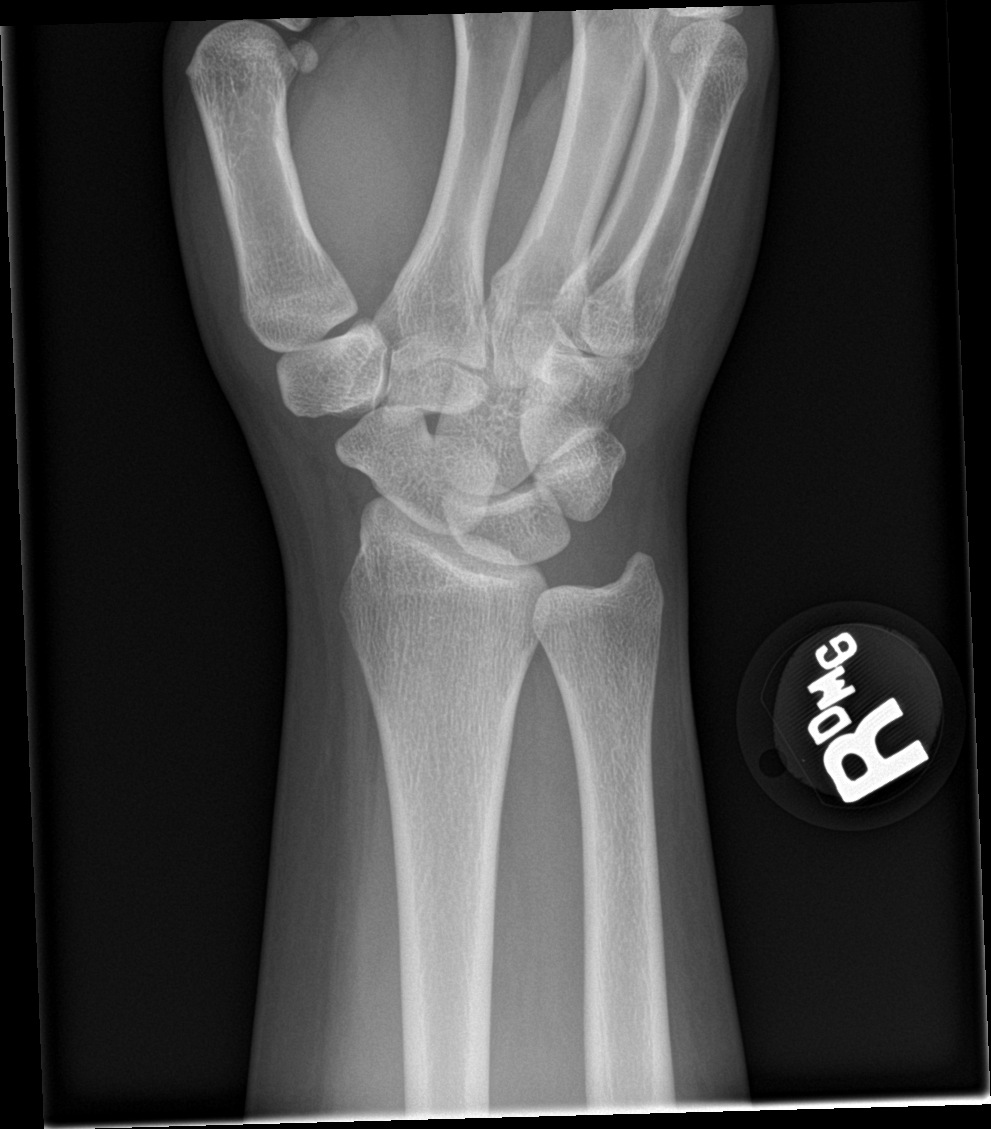

[wrist lat]
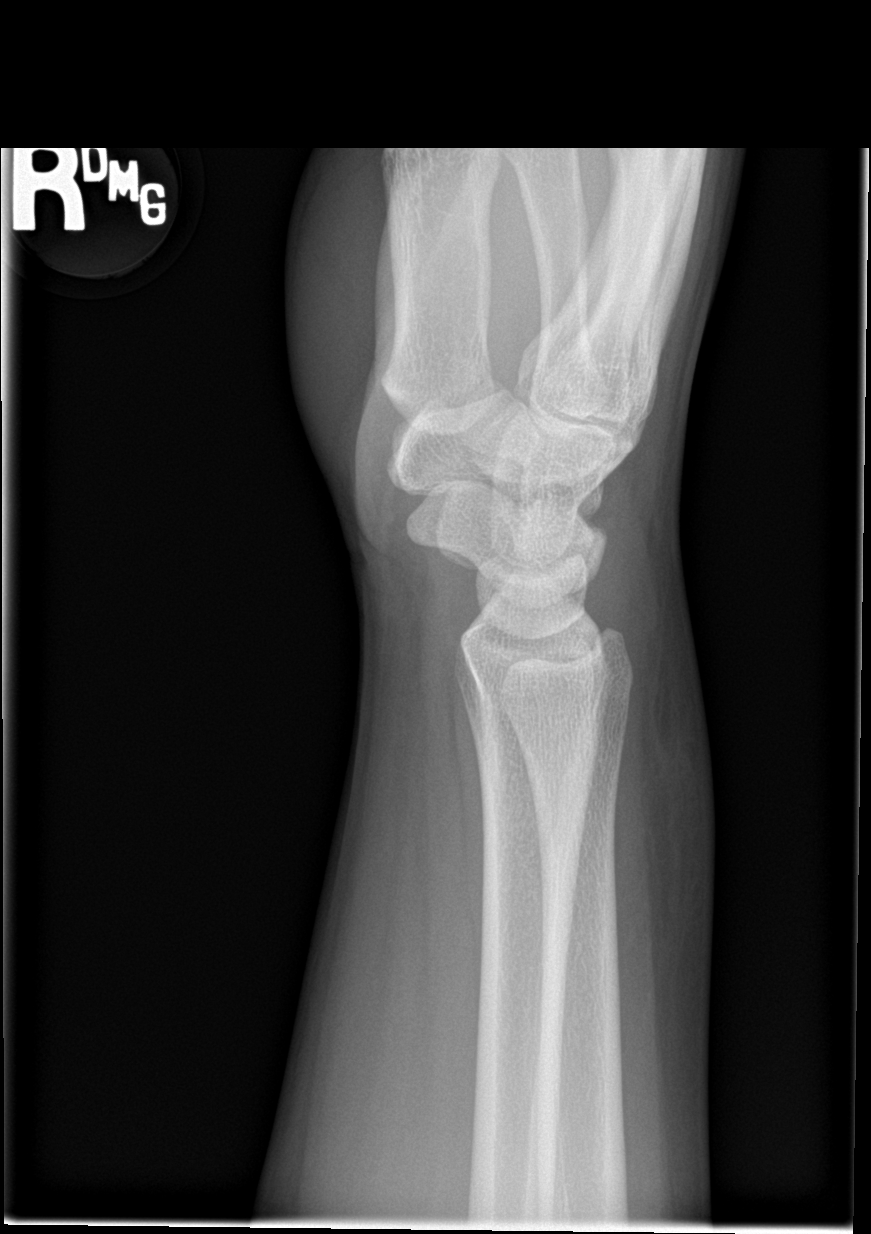

[wrist ap (2 of 2)]
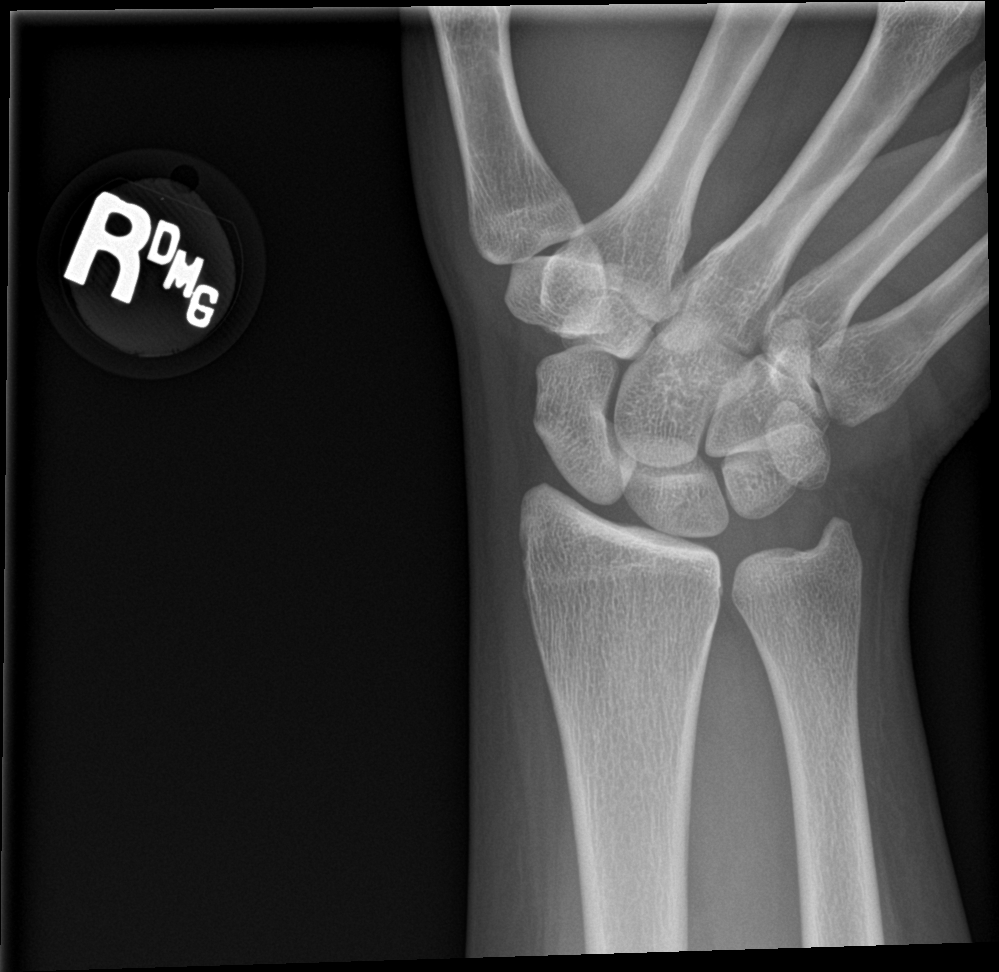

[4 of 4 positions shown; findings below may reference images not displayed]

FINDINGS: There is no evidence of fracture or dislocation. There is no
evidence of arthropathy or other focal bone abnormality. Soft
tissues are unremarkable.
IMPRESSION: Negative.

## 2024-03-21 ENCOUNTER — Other Ambulatory Visit (HOSPITAL_COMMUNITY): Payer: Self-pay

## 2024-03-21 ENCOUNTER — Other Ambulatory Visit: Payer: Self-pay

## 2024-03-21 MED ORDER — DUPIXENT 300 MG/2ML ~~LOC~~ SOAJ
300.0000 mg | SUBCUTANEOUS | 3 refills | Status: DC
Start: 2024-03-21 — End: 2024-10-23

## 2024-03-21 MED ORDER — DUPIXENT 300 MG/2ML ~~LOC~~ SOAJ: SUBCUTANEOUS | 0 refills | Status: DC

## 2024-03-22 ENCOUNTER — Other Ambulatory Visit: Payer: Self-pay

## 2024-04-25 ENCOUNTER — Ambulatory Visit (HOSPITAL_COMMUNITY)
Admission: RE | Admit: 2024-04-25 | Discharge: 2024-04-25 | Disposition: A | Discharge status: Home / Self Care | Payer: Self-pay | Source: Ambulatory Visit | Attending: Family Medicine | Admitting: Family Medicine

## 2024-04-25 ENCOUNTER — Encounter (HOSPITAL_COMMUNITY): Payer: Self-pay

## 2024-04-25 VITALS — BP 124/81 | HR 80 | Temp 98.2°F | Resp 16

## 2024-04-25 DIAGNOSIS — H6991 Unspecified Eustachian tube disorder, right ear: Secondary | ICD-10-CM

## 2024-04-25 HISTORY — DX: Anxiety disorder, unspecified: F41.9

## 2024-04-25 MED ORDER — FLUTICASONE PROPIONATE 50 MCG/ACT NA SUSP: 2.0000 | Freq: Every day | NASAL | 0 refills | Status: AC

## 2024-04-25 NOTE — ED Triage Notes (Signed)
 Pt states right ear pain and ringing since yesterday.  States she used a ear wax removal kit yesterday but did not get anything out.

## 2024-04-25 NOTE — Discharge Instructions (Signed)
 Your eustachian tubes are inflamed causing ear fullness/symptoms.   Flonase 2 puffs into each nostril daily.  Take oral antihistamine daily (choose one of the following: Claritin, allegra, zyrtec).   Tylenol as needed for pain.  If you develop any new or worsening symptoms or if your symptoms do not start to improve, please return here or follow-up with your primary care provider. If your symptoms are severe, please go to the emergency room.

## 2024-04-25 NOTE — ED Provider Notes (Signed)
 [facility]    CSN: [id number] Arrival date & time: 04/25/24  1128      History   Chief Complaint Chief Complaint  Patient presents with   Ear Injury    Woke up on April 29 with ringing and all sounds muffled.Residual pain lasted even when all other symptoms went away. - Entered by patient   Otalgia    HPI Katrina Burgess is a 28 y.o. female.   Patient presents to urgent care for evaluation of right sided ear pain that started 2 days ago.  Pain started with a muffled feeling to the right ear and then progressed to ringing sensation.  Ringing sensation has improved since onset, however she still has muffled hearing to the right ear.  Attempted use of earwax removal kit due to presumed earwax impaction.  She was unable to remove any wax from the ear.  History of congenital left ear deafness. Denies purulent drainage from the right ear. No fevers, chills, headache, viral URI symptoms, dizziness, and recent injuries to the ears. She has a history of seasonal allergies and takes zyrtec daily for this. She does not use flonase and has not attempted use of tylenol/motrin  for ear discomfort PTA.      Past Medical History:  Diagnosis Date   Anxiety     There are no active problems to display for this patient.   Past Surgical History:  Procedure Laterality Date   MASTECTOMY Bilateral     OB History   No obstetric history on file.      Home Medications    Prior to Admission medications   Medication Sig Start Date End Date Taking? Authorizing Provider  fluticasone (FLONASE) 50 MCG/ACT nasal spray Place 2 sprays into both nostrils daily. 04/25/24  Yes Starlene Eaton, FNP  mirtazapine (REMERON) 7.5 MG tablet Take by mouth. 04/08/24  Yes [provider]  venlafaxine XR (EFFEXOR-XR) 150 MG 24 hr capsule Take 150 mg by mouth 2 (two) times daily. 04/08/24  Yes [provider]  Dupilumab (DUPIXENT) 300 MG/2ML SOAJ Inject 300 mg into the skin every 14  (fourteen) days. 03/21/24       Family History History reviewed. No pertinent family history.  Social History Social History   Tobacco Use   Smoking status: Never    Passive exposure: Never   Smokeless tobacco: Never  Substance Use Topics   Alcohol use: Not Currently   Drug use: Never     Allergies   Patient has no known allergies.   Review of Systems Review of Systems Per HPI  Physical Exam Triage Vital Signs ED Triage Vitals  Encounter Vitals Group     BP 04/25/24 1138 124/81     Systolic BP Percentile --      Diastolic BP Percentile --      Pulse Rate 04/25/24 1138 80     Resp 04/25/24 1138 16     Temp 04/25/24 1138 98.2 F (36.8 C)     Temp Source 04/25/24 1138 Oral     SpO2 04/25/24 1138 99 %     Weight --      Height --      Head Circumference --      Peak Flow --      Pain Score 04/25/24 1140 3     Pain Loc --      Pain Education --      Exclude from Growth Chart --    No data found.  Updated Vital  Signs BP 124/81 (BP Location: Left Arm)   Pulse 80   Temp 98.2 F (36.8 C) (Oral)   Resp 16   SpO2 99%   Visual Acuity Right Eye Distance:   Left Eye Distance:   Bilateral Distance:    Right Eye Near:   Left Eye Near:    Bilateral Near:     Physical Exam Vitals and nursing note reviewed.  Constitutional:      Appearance: She is not ill-appearing or toxic-appearing.  HENT:     Head: Normocephalic and atraumatic.     Jaw: There is normal jaw occlusion.     Right Ear: Hearing and external ear normal. No decreased hearing noted. No drainage, swelling or tenderness. A middle ear effusion is present. Tympanic membrane is not erythematous or bulging.     Left Ear: External ear normal. Decreased hearing (congenital deafness to left ear) noted.     Nose: Nose normal.     Mouth/Throat:     Lips: Pink.  Eyes:     General: Lids are normal. Vision grossly intact. Gaze aligned appropriately.     Extraocular Movements: Extraocular movements intact.      Conjunctiva/sclera: Conjunctivae normal.  Pulmonary:     Effort: Pulmonary effort is normal.  Musculoskeletal:     Cervical back: Neck supple.  Skin:    General: Skin is warm and dry.     Capillary Refill: Capillary refill takes less than 2 seconds.     Findings: No rash.  Neurological:     General: No focal deficit present.     Mental Status: She is alert and oriented to person, place, and time. Mental status is at baseline.     Cranial Nerves: No dysarthria or facial asymmetry.  Psychiatric:        Mood and Affect: Mood normal.        Speech: Speech normal.        Behavior: Behavior normal.        Thought Content: Thought content normal.        Judgment: Judgment normal.      UC Treatments / Results  Labs (all labs ordered are listed, but only abnormal results are displayed) Labs Reviewed - No data to display  EKG   Radiology No results found.  Procedures Procedures (including critical care time)  Medications Ordered in UC Medications - No data to display  Initial Impression / Assessment and Plan / UC Course  I have reviewed the triage vital signs and the nursing notes.  Pertinent labs & imaging results that were available during my care of the patient were reviewed by me and considered in my medical decision making (see chart for details).   1. Dysfunction of right eustachian tube Presentation consistent with eustachian tube dysfunction.  No signs of otitis media, TMJ, injury.  Flonase and oral antihistamine recommended. May switch from Zyrtec to Xyzal if zyrtec isn't helping since she has taken Zyrtec for a long period of time.  Tylenol as needed for pain.   Counseled patient on potential for adverse effects with medications prescribed/recommended today, strict ER and return-to-clinic precautions discussed, patient verbalized understanding.    Final Clinical Impressions(s) / UC Diagnoses   Final diagnoses:  Dysfunction of right eustachian tube      Discharge Instructions      Your eustachian tubes are inflamed causing ear fullness/symptoms.   Flonase 2 puffs into each nostril daily.  Take oral antihistamine daily (choose one of the following: Claritin, allegra,  zyrtec).   Tylenol as needed for pain.  If you develop any new or worsening symptoms or if your symptoms do not start to improve, please return here or follow-up with your primary care provider. If your symptoms are severe, please go to the emergency room.    ED Prescriptions     Medication Sig Dispense Auth. Provider   fluticasone (FLONASE) 50 MCG/ACT nasal spray Place 2 sprays into both nostrils daily. 16 g Starlene Eaton, FNP      PDMP not reviewed this encounter.   Starlene Eaton, [place] 04/25/24 1159

## 2024-04-26 ENCOUNTER — Encounter (HOSPITAL_BASED_OUTPATIENT_CLINIC_OR_DEPARTMENT_OTHER): Payer: Self-pay | Admitting: *Deleted
